# Patient Record
Sex: Male | Born: 1970 | Race: Black or African American | Hispanic: No | Marital: Married | Smoking: Never smoker
Health system: Southern US, Community
[De-identification: ages and names within clinical notes are randomized; demographics above are authoritative.]

## PROBLEM LIST (undated history)

## (undated) DIAGNOSIS — I10 Essential (primary) hypertension: Secondary | ICD-10-CM

## (undated) HISTORY — PX: ABDOMINAL SURGERY: SHX537

## (undated) HISTORY — PX: HERNIA REPAIR: SHX51

## (undated) HISTORY — DX: Essential (primary) hypertension: I10

---

## 2001-10-01 ENCOUNTER — Inpatient Hospital Stay (HOSPITAL_COMMUNITY): Admission: EM | Admit: 2001-10-01 | Discharge: 2001-10-01 | Payer: Self-pay | Admitting: *Deleted

## 2001-10-01 ENCOUNTER — Encounter: Payer: Self-pay | Admitting: Emergency Medicine

## 2008-01-25 ENCOUNTER — Encounter: Admission: RE | Admit: 2008-01-25 | Discharge: 2008-01-25 | Payer: Self-pay | Admitting: Internal Medicine

## 2009-09-18 ENCOUNTER — Emergency Department (HOSPITAL_COMMUNITY): Admission: EM | Admit: 2009-09-18 | Discharge: 2009-09-18 | Payer: Self-pay | Admitting: Emergency Medicine

## 2010-11-23 ENCOUNTER — Inpatient Hospital Stay (INDEPENDENT_AMBULATORY_CARE_PROVIDER_SITE_OTHER)
Admission: RE | Admit: 2010-11-23 | Discharge: 2010-11-23 | Disposition: A | Discharge status: Home / Self Care | Payer: BC Managed Care – PPO | Source: Ambulatory Visit | Attending: Emergency Medicine | Admitting: Emergency Medicine

## 2010-11-23 DIAGNOSIS — K219 Gastro-esophageal reflux disease without esophagitis: Secondary | ICD-10-CM

## 2011-01-31 NOTE — Consult Note (Signed)
[place]. [facility]  Patient:    Travis Vasquez Visit Number: [id number] MRN: [id number]          Service Type: MED Location: RCRM 2550 10 Attending Physician:  Susa Day Dictated by:   Katy Fitch Naaman Plummer., M.D. Proc. Date: 10/01/01 Admit Date:  10/01/2001 Discharge Date: 10/01/2001                            Consultation Report  CHIEF COMPLAINT:  Crushing amputation, left long finger with loss of nail and nail matrix and tuft of distal phalanx.  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  Earlier this evening while using a truck, he accidentally caught his left long finger between some shelves and a heavy portion of the machinery sustaining a crushing amputation of his left long fingertip.  He was transported to the [facility] Emergency Department where he was evaluated by Lorre Nick, M.D. emergency room physician.  X-rays of his finger documented an untidy avulsion amputation of his left long finger tip through the distal metaphysis of the distal phalanx.  He had an oblique soft tissue loss involving pulp, nail plate, nail fold, and exposure of the distal phalanx.  A 0.25% Marcaine digital block was placed for analgesia followed by 1 gm of Ancef as an IV prophylactic antibiotic.  His tetanus immunization was brought up to date.  A hand surgery consult was requested.  By clinical exam, Travis Vasquez was noted to have an untidy avulsion amputation. He had less than 50% of his ____________ nail matrix remaining.  Inspection of his x-rays revealed that he had a severely comminuted fracture.  Informed consent was completed with Travis Vasquez and his father.  We recommended transfer to the operating room for irrigation and debridement of his wound followed by probable revision amputation with removal of the remaining nail matrix.  An alternative  discussed was a possible reconstruction of the finger with a thenar flap.  Given the degree of bone loss as well as nail matrix loss in my judgment, revision is the more appropriate procedure, pending our detailed examination in the operating room.  Travis Vasquez past history was remarkable for no drug allergies.  He is on no current medications.  He has had minor surgeries in the past for repair of lacerations to his face and leg.  He had had no major surgery.  Family and social history are noncontributory and his review of systems noncontributory.  PHYSICAL EXAMINATION:  GENERAL:  The patient is a well-appearing 40 year old man.  He was awake and alert.  VITAL SIGNS:  Temperature 97.2, blood pressure 117/69, heart rate 69, respiratory rate 15.  There was no sign of trauma to any part other than his long finger.  HEENT:  Normocephalic and atraumatic.  NECK:  Unremarkable.  CHEST:  Clear to auscultation.  HEART:  S1, S2, no murmur or gallop appreciated.  ABDOMEN:  Normal.  NEUROLOGIC:  Normal except for the left long finger which was anesthetized due to Dr. West Carbo block.  ORTHOPEDIC:  His gait and stance were not tested.  His right upper extremity and both lower extremities looked normal.  The left upper extremity revealed an untidy amputation as mentioned above.  X-rays were reviewed revealing comminuted fracture of the distal phalanx with loss of the tuft.  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  PLAN:  He is scheduled for debridement and probable revision of his finger in the operating room immediately.  There is a small chance we will be able to reconstruct the fingertip from the thenar flap; however, given his occupational choices, he would be better served by a slightly shorter finger with normal sensibility.  Questions regarding the aforementioned procedure were invited and answered. Dictated by:   Katy Fitch Naaman Plummer., M.D. Attending  Physician:  Susa Day DD:  10/01/01 TD:  10/01/01 Job: [phone]

## 2011-01-31 NOTE — Op Note (Signed)
[place]. [facility]  Patient:    Travis Vasquez, Travis Vasquez Visit Number: [id number] MRN: [id number]          Service Type: MED Location: RCRM 2550 10 Attending Physician:  Susa Day Dictated by:   Katy Fitch Naaman Plummer., M.D. Proc. Date: 10/01/01 Admit Date:  10/01/2001 Discharge Date: 10/01/2001                             Operative Report  PREOPERATIVE DIAGNOSIS:  Severe crushing amputation, left long finger with comminuted fracture of the distal phalanx with avulsion of 80% of the nail matrix.  POSTOPERATIVE DIAGNOSIS:  Severe crushing amputation, left long finger with comminuted fracture of the distal phalanx with avulsion of 80% of the nail matrix.  OPERATION PERFORMED: 1. Revision amputation of left long finger with radial and ulnar flap closure. 2. Resection of residual dorsal intermediate and ventral nail fold.  SURGEON:  Katy Fitch. Sypher, Montez Hageman., M.D.  ASSISTANT:  None.  ANESTHESIA:  0.25% Marcaine and 2% lidocaine metacarpal head level block, left long finger supplemented by IV sedation.  SUPERVISING ANESTHESIOLOGIST:  Dr. Krista Blue.  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  He was transported to [facility] for definitive care of his finger.  A hand surgery consult was requested.  The aforementioned injury was noted and elective revision surgery to the finger recommended.  DESCRIPTION OF PROCEDURE:  Keeton Kassebaum was brought to the operating room and placed in supine position on the operating table.  The digital block placed by Dr. Freida Busman in the emergency room was still in effect.  This was supplemented with 0.25% Marcaine and 2% lidocaine at the metacarpal head level.  The left arm was prepped and Betadine soap and solution and sterilely  draped. The long finger was exsanguinated with gauze wrap and a half-inch Penrose drain was placed at the base of the finger as a digital tourniquet.  The procedure commenced with orderly debridement of the wound.  Devitalized skin and subcutaneous tissues were sharply divided.  The wound was irrigated with sterile saline and the fracture configuration identified.  The remaining dorsal intermediate and ventral nail fold was removed with 15 blade and 64 blade and rongeur.  Care was taken to remove the periosteum of the dorsal surface of the distal phalanx.  The distal phalanx was then shortened to a proximal metaphyseal level and rounded with rongeurs and a bone cutter.  The radial and ulnar flaps were regular; however, the soft tissue flap of fat and other subcutaneous tissues was adequate to cover the distal phalanx.  I covered approximately 80% of the wound with radial and ulnar flaps and will allow the remaining skin to heal by secondary intention.  This should optimize Mr. Spencers sensibility of the fingertip.  The wound was lavaged with sterile saline followed by dressing the wound with Xeroflo, sterile gauze and Coban.  There were no apparent complications.  For aftercare, Mr. Nabers was given prescriptions for Percocet 7.5 mg 1 or 2 tablets p.o. q.4-6h. pain and Keflex 500 mg 1 p.o. q.8h. x 5 days as a prophylactic antibiotic. Dictated by:   Katy Fitch Naaman Plummer., M.D. Attending Physician:  Susa Day DD:  10/01/01 TD:  10/01/01 Job: [phone] [id number]

## 2016-05-10 ENCOUNTER — Emergency Department (HOSPITAL_COMMUNITY)
Admission: EM | Admit: 2016-05-10 | Discharge: 2016-05-10 | Disposition: A | Discharge status: Home / Self Care | Payer: BLUE CROSS/BLUE SHIELD | Attending: Emergency Medicine | Admitting: Emergency Medicine

## 2016-05-10 ENCOUNTER — Encounter (HOSPITAL_COMMUNITY): Payer: Self-pay | Admitting: *Deleted

## 2016-05-10 DIAGNOSIS — M25562 Pain in left knee: Secondary | ICD-10-CM

## 2016-05-10 DIAGNOSIS — M25462 Effusion, left knee: Secondary | ICD-10-CM

## 2016-05-10 MED ORDER — NAPROXEN 375 MG PO TABS: 375.0000 mg | ORAL_TABLET | Freq: Two times a day (BID) | ORAL | 0 refills | Status: DC

## 2016-05-10 NOTE — Discharge Instructions (Signed)
Return to the Ed if you develop symptoms of swelling, pain, heaviness, heat, redness in the left leg.

## 2016-05-10 NOTE — ED Provider Notes (Signed)
[facility]-EMERGENCY DEPT Provider Note   CSN: [id number] Arrival date & time: 05/10/16  1920  By signing my name below, I, Alyssa Grove, attest that this documentation has been prepared under the direction and in the presence of Arthor Captain, PA-C. Electronically Signed: Alyssa Grove, ED Scribe. 05/10/16. 7:56 PM.  History   Chief Complaint Chief Complaint  Patient presents with  . Knee Pain    The history is provided by the patient. No language interpreter was used.    HPI Comments: Travis Vasquez is a 45 y.o. male who presents to the Emergency Department complaining of constant left knee pain onset 1 week. Pt states when feels a painful pop when he goes to straighten his left knee. Pt states last week he shot hoops with his son and noticed his knee felt tight. He states he has been dealing with fluid this whole week and went to have the fluid drained. Pt states he was seen by his PCP today for left knee pain and was diagnosed with a broken left knee. Pt was then informed that he had a blood clot in his left knee. Pt did not receive an [place]. Pt had arthroscopic surgery 5 years ago. Pt denies leg pain.  History reviewed. No pertinent past medical history.  There are no active problems to display for this patient.   History reviewed. No pertinent surgical history.   Home Medications    Prior to Admission medications   Not on File    Family History History reviewed. No pertinent family history.  Social History Social History  Substance Use Topics  . Smoking status: Never Smoker  . Smokeless tobacco: Never Used  . Alcohol use No     Allergies   Keflex [cephalexin]   Review of Systems Review of Systems  Constitutional: Negative for fever.  Musculoskeletal: Positive for arthralgias.    Physical Exam Updated Vital Signs BP 141/94 (BP Location: Right Arm)   Pulse 85   Temp 98.1 F (36.7 C) (Oral)   Resp 16   Ht 5\' 9"  (1.753 m)   Wt 205 lb 3 oz (93.1 kg)   SpO2  96%   BMI 30.30 kg/m   Physical Exam  Constitutional: He appears well-developed and well-nourished.  HENT:  Head: Normocephalic.  Eyes: Conjunctivae are normal.  Cardiovascular: Normal rate.   Pulmonary/Chest: Effort normal. No respiratory distress.  Abdominal: He exhibits no distension.  Musculoskeletal: Normal range of motion.  Full ROM of left knee No crepitus  Ligaments are stable No calf tenderness   Neurological: He is alert.  Skin: Skin is warm and dry.  Psychiatric: He has a normal mood and affect. His behavior is normal.  Nursing note and vitals reviewed.  ED Treatments / Results  DIAGNOSTIC STUDIES: Oxygen Saturation is 96% on RA, adequate by my interpretation.    COORDINATION OF CARE: 7:56 PM Discussed treatment plan with pt at bedside which includes Knee Sleeve and Sports Medicine Referral and pt agreed to plan.  Labs (all labs ordered are listed, but only abnormal results are displayed) Labs Reviewed - No data to display  EKG  EKG Interpretation None       Radiology No results found.  Procedures Procedures (including critical care time)  Medications Ordered in ED Medications - No data to display   Initial Impression / Assessment and Plan / ED Course  I have reviewed the triage vital signs and the nursing notes.  Pertinent labs & imaging results that were available during my care  of the patient were reviewed by me and considered in my medical decision making (see chart for details).  Clinical Course   Took imaging down to radiology room and reviewed CD with Dr. 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  Will send home with knee sleeve and referall to Dr. Pearletha ForgeHudnall in Sports Medicine  Patient X-Ray negative for obvious fracture or dislocation.  Pain managed in ED. Pt advised to follow up with orthopedics if symptoms persist for possibility of missed fracture diagnosis. Patient given brace while in ED, conservative therapy recommended and discussed. Patient will be dc home & is agreeable with above plan.  Final Clinical Impressions(s) / ED Diagnoses   Final diagnoses:  Knee pain, acute, left  Knee effusion, left    New Prescriptions New Prescriptions   No medications on file   I personally performed the services described in this documentation, which was scribed in my presence. The recorded information has been reviewed and is accurate.       Arthor Captainbigail Immaculate Crutcher, PA-C 05/10/16 2035    Arby BarretteMarcy Pfeiffer, MD 05/10/16 [phone]

## 2016-05-10 NOTE — ED Triage Notes (Addendum)
Pt states he went to PCP for left knee pain, had XR done told he had a broken left knee, then came back and told him he had a blood clot in his left knee, pt was instructed to come to ED for further eval. [facility] Pt did not have a US done. Pt reports knee pain for a week.

## 2016-05-13 ENCOUNTER — Ambulatory Visit (INDEPENDENT_AMBULATORY_CARE_PROVIDER_SITE_OTHER): Payer: BLUE CROSS/BLUE SHIELD | Admitting: Family Medicine

## 2016-05-13 ENCOUNTER — Encounter: Payer: Self-pay | Admitting: Family Medicine

## 2016-05-13 DIAGNOSIS — M25562 Pain in left knee: Secondary | ICD-10-CM

## 2016-05-13 NOTE — Patient Instructions (Signed)
Your pain and swelling are consistent with post-surgical synovitis, less likely very mild arthritis (your x-rays look good though) or new meniscus tear. These are treated similarly. Take aleve 2 tabs twice a day with food OR ibuprofen 600mg  three times a day with food for pain and inflammation. Icing 15 minutes at a time 3-4 times a day. Elevate above the level of your heart as much as possible. Compression sleeve or ACE wrap to keep swelling down. Avoid deep squats, deep lunges, leg press for next couple weeks. Call me if you're struggling and want to try an injection. Otherwise follow up with me as needed.

## 2016-05-15 DIAGNOSIS — M25562 Pain in left knee: Secondary | ICD-10-CM | POA: Insufficient documentation

## 2016-05-15 NOTE — Assessment & Plan Note (Signed)
Exam is benign, reassuring and no mechanism of injury.  Independently reviewed radiographs and no acute fracture noted - at tibial tubercle he has well corticated calcification consistent with with intratendinous calcification or history of osgood-schlatters when he was younger.  Ultrasound confirms no edema surrounding this to suggest acute fracture.  Reassured patient.  Likely due to post-surgical synovitis, less likely mild arthritis or new meniscus tear (no injury).  Start with icing, compression, elevation, nsaids.  Consider injection if not improving.  F/u prn.

## 2016-05-15 NOTE — Progress Notes (Signed)
PCP: No PCP Per Patient  Subjective:   HPI: Patient is a 45 y.o. male here for left knee pain.  Patient reports on 8/19 he was shooting basketball with his son. No acute injury during this but day afterwards his left knee felt tight, swollen. No pain posteriorly. Had radiographs and told he had a fracture - sent to ED and given a sleeve there. Pain is 0/10 at rest and has improved. Was 10/10 and sharp at worst, difficult to walk. Pops superior patella area. History of arthroscopic surgery of this knee about 5 years ago. No skin changes, numbness.  No past medical history on file.  Current Outpatient Prescriptions on File Prior to Visit  Medication Sig Dispense Refill  . naproxen (NAPROSYN) 375 MG tablet Take 1 tablet (375 mg total) by mouth 2 (two) times daily. 20 tablet 0   No current facility-administered medications on file prior to visit.     Past Surgical History:  Procedure Laterality Date  . ABDOMINAL SURGERY      Allergies  Allergen Reactions  . Keflex [Cephalexin] Nausea And Vomiting    Social History   Social History  . Marital status: Single    Spouse name: N/A  . Number of children: N/A  . Years of education: N/A   Occupational History  . Not on file.   Social History Main Topics  . Smoking status: Never Smoker  . Smokeless tobacco: Never Used  . Alcohol use No  . Drug use: No  . Sexual activity: Not on file   Other Topics Concern  . Not on file   Social History Narrative  . No narrative on file    No family history on file.  BP 133/86   Pulse 71   Ht 5\' 9"  (1.753 m)   Wt 205 lb (93 kg)   BMI 30.27 kg/m   Review of Systems: See HPI above.    Objective:  Physical Exam:  Gen: NAD, comfortable in exam room  Left knee: Mild effusion.  No bruising, other deformity. Minimal tenderness medial patellar facet.  No other tenderness. FROM - tightness with full flexion. Negative ant/post drawers. Negative valgus/varus testing. Negative  lachmanns. Negative mcmurrays, apleys, patellar apprehension. NV intact distally.  Right knee: FROM without pain.    Assessment & Plan:  1. Left knee pain - Exam is benign, reassuring and no mechanism of injury.  Independently reviewed radiographs and no acute fracture noted - at tibial tubercle he has well corticated calcification consistent with with intratendinous calcification or history of osgood-schlatters when he was younger.  Ultrasound confirms no edema surrounding this to suggest acute fracture.  Reassured patient.  Likely due to post-surgical synovitis, less likely mild arthritis or new meniscus tear (no injury).  Start with icing, compression, elevation, nsaids.  Consider injection if not improving.  F/u prn.

## 2016-05-23 ENCOUNTER — Ambulatory Visit: Payer: BLUE CROSS/BLUE SHIELD | Admitting: Family Medicine

## 2016-05-26 ENCOUNTER — Ambulatory Visit: Payer: BLUE CROSS/BLUE SHIELD | Admitting: Family Medicine

## 2016-05-29 ENCOUNTER — Encounter: Payer: Self-pay | Admitting: Family Medicine

## 2016-05-29 ENCOUNTER — Ambulatory Visit (INDEPENDENT_AMBULATORY_CARE_PROVIDER_SITE_OTHER): Payer: BLUE CROSS/BLUE SHIELD | Admitting: Family Medicine

## 2016-05-29 DIAGNOSIS — M25562 Pain in left knee: Secondary | ICD-10-CM | POA: Diagnosis not present

## 2016-05-29 NOTE — Patient Instructions (Signed)
Take aleve 2 tabs twice a day with food OR ibuprofen 600mg  three times a day with food for pain and inflammation only if needed. Icing 15 minutes at a time 3-4 times a day as needed. Elevate above the level of your heart as much as possible for swelling. Compression sleeve or ACE wrap to keep swelling down. Avoid deep squats, deep lunges, leg press for 4 more weeks. Consider physical therapy, MRI, injection (though with your bad reaction with hiccuping would likely do the other two instead) if not improving. Follow up with me in 4 weeks or as needed.

## 2016-06-03 NOTE — Assessment & Plan Note (Signed)
Exam is benign, reassuring and no mechanism of injury.  Independently reviewed radiographs last visit and no acute fracture noted.  Likely due to post-surgical synovitis, less likely mild arthritis or new meniscus tear (no injury).  He would like to continue conservative measures for now - nsaids, icing, compression, home exercises.  F/u in 4 weeks or prn.  Light duty note provided for 4 weeks.

## 2016-06-03 NOTE — Progress Notes (Signed)
PCP: No PCP Per Patient  Subjective:   HPI: Patient is a 45 y.o. male here for left knee pain.  8/29: Patient reports on 8/19 he was shooting basketball with his son. No acute injury during this but day afterwards his left knee felt tight, swollen. No pain posteriorly. Had radiographs and told he had a fracture - sent to ED and given a sleeve there. Pain is 0/10 at rest and has improved. Was 10/10 and sharp at worst, difficult to walk. Pops superior patella area. History of arthroscopic surgery of this knee about 5 years ago. No skin changes, numbness.  9/14: Patient reports he feels better compared to last visit. Still can't fully bend the knee. Some pain and popping of the knee. Pain at rest is 0/10. Has done naproxen - still babying the knee. No skin changes, numbness.  No past medical history on file.  Current Outpatient Prescriptions on File Prior to Visit  Medication Sig Dispense Refill  . naproxen (NAPROSYN) 375 MG tablet Take 1 tablet (375 mg total) by mouth 2 (two) times daily. 20 tablet 0   No current facility-administered medications on file prior to visit.     Past Surgical History:  Procedure Laterality Date  . ABDOMINAL SURGERY      Allergies  Allergen Reactions  . Keflex [Cephalexin] Nausea And Vomiting    Social History   Social History  . Marital status: Single    Spouse name: N/A  . Number of children: N/A  . Years of education: N/A   Occupational History  . Not on file.   Social History Main Topics  . Smoking status: Never Smoker  . Smokeless tobacco: Never Used  . Alcohol use No  . Drug use: No  . Sexual activity: Not on file   Other Topics Concern  . Not on file   Social History Narrative  . No narrative on file    No family history on file.  BP 135/85   Pulse 70   Ht 5\' 10"  (1.778 m)   Wt 205 lb (93 kg)   BMI 29.41 kg/m   Review of Systems: See HPI above.    Objective:  Physical Exam:  Gen: NAD, comfortable in  exam room  Left knee: Minimal effusion.  No bruising, other deformity. Minimal tenderness medial patellar facet.  No other tenderness. FROM - tightness with full flexion. Negative ant/post drawers. Negative valgus/varus testing. Negative lachmanns. Negative mcmurrays, apleys, patellar apprehension. NV intact distally.  Right knee: FROM without pain.    Assessment & Plan:  1. Left knee pain - Exam is benign, reassuring and no mechanism of injury.  Independently reviewed radiographs last visit and no acute fracture noted.  Likely due to post-surgical synovitis, less likely mild arthritis or new meniscus tear (no injury).  He would like to continue conservative measures for now - nsaids, icing, compression, home exercises.  F/u in 4 weeks or prn.  Light duty note provided for 4 weeks.

## 2017-10-27 ENCOUNTER — Encounter (HOSPITAL_COMMUNITY): Payer: Self-pay | Admitting: Family Medicine

## 2017-10-27 ENCOUNTER — Ambulatory Visit (HOSPITAL_COMMUNITY)
Admission: EM | Admit: 2017-10-27 | Discharge: 2017-10-27 | Disposition: A | Discharge status: Home / Self Care | Payer: BC Managed Care – PPO | Attending: Family Medicine | Admitting: Family Medicine

## 2017-10-27 DIAGNOSIS — R69 Illness, unspecified: Secondary | ICD-10-CM

## 2017-10-27 DIAGNOSIS — J209 Acute bronchitis, unspecified: Secondary | ICD-10-CM

## 2017-10-27 DIAGNOSIS — J111 Influenza due to unidentified influenza virus with other respiratory manifestations: Secondary | ICD-10-CM

## 2017-10-27 MED ORDER — BENZONATATE 100 MG PO CAPS: 100.0000 mg | ORAL_CAPSULE | Freq: Three times a day (TID) | ORAL | 0 refills | Status: DC

## 2017-10-27 MED ORDER — IPRATROPIUM BROMIDE 0.06 % NA SOLN: 2.0000 | Freq: Four times a day (QID) | NASAL | 0 refills | Status: DC

## 2017-10-27 MED ORDER — PREDNISONE 20 MG PO TABS: 40.0000 mg | ORAL_TABLET | Freq: Every day | ORAL | 0 refills | Status: AC

## 2017-10-27 MED ORDER — FLUTICASONE PROPIONATE 50 MCG/ACT NA SUSP: 2.0000 | Freq: Every day | NASAL | 0 refills | Status: DC

## 2017-10-27 MED ORDER — AZITHROMYCIN 250 MG PO TABS: 250.0000 mg | ORAL_TABLET | Freq: Every day | ORAL | 0 refills | Status: DC

## 2017-10-27 NOTE — ED Triage Notes (Signed)
Pt here for cold symptoms over the past few weeks and this am woke up with fever, chills and headache. Took ibuprofen.

## 2017-10-27 NOTE — Discharge Instructions (Signed)
Start azithromycin and prednisone for bronchitis. Tessalon for cough. Start flonase, atrovent nasal spray for nasal congestion/drainage. You can use over the counter nasal saline rinse such as neti pot for nasal congestion. Keep hydrated, your urine should be clear to pale yellow in color. Tylenol/motrin for fever and pain. Monitor for any worsening of symptoms, chest pain, shortness of breath, wheezing, swelling of the throat, follow up for reevaluation.

## 2017-10-27 NOTE — ED Provider Notes (Signed)
[facility]    CSN: [id number] Arrival date & time: 10/27/17  1230     History   Chief Complaint Chief Complaint  Patient presents with  . Fever  . Headache    HPI Travis Vasquez is a 47 y.o. male.   47 year old male comes in for 2 week history of URI symptoms with sudden worsening last night. Had continued productive cough, rhinorrhea, nasal congestion.  States woke up this morning with fever, body aches, worsening cough. Tmax 102, ibuprofen. States recent flu contact. Nasal spray with good relief. Never smoker.       History reviewed. No pertinent past medical history.  Patient Active Problem List   Diagnosis Date Noted  . Left knee pain 05/15/2016    Past Surgical History:  Procedure Laterality Date  . ABDOMINAL SURGERY         Home Medications    Prior to Admission medications   Medication Sig Start Date End Date Taking? Authorizing Provider  azithromycin (ZITHROMAX) 250 MG tablet Take 1 tablet (250 mg total) by mouth daily. Take first 2 tablets together, then 1 every day until finished. 10/27/17   Cathie HoopsYu, Antwyne Pingree V, PA-C  benzonatate (TESSALON) 100 MG capsule Take 1 capsule (100 mg total) by mouth every 8 (eight) hours. 10/27/17   Cathie HoopsYu, Torie Priebe V, PA-C  fluticasone (FLONASE) 50 MCG/ACT nasal spray Place 2 sprays into both nostrils daily. 10/27/17   Cathie HoopsYu, Muriel Wilber V, PA-C  ipratropium (ATROVENT) 0.06 % nasal spray Place 2 sprays into both nostrils 4 (four) times daily. 10/27/17   Cathie HoopsYu, Chelsei Mcchesney V, PA-C  naproxen (NAPROSYN) 375 MG tablet Take 1 tablet (375 mg total) by mouth 2 (two) times daily. 05/10/16   Arthor CaptainHarris, Abigail, PA-C  predniSONE (DELTASONE) 20 MG tablet Take 2 tablets (40 mg total) by mouth daily for 3 days. 10/27/17 10/30/17  Belinda FisherYu, Jiah Bari V, PA-C    Family History History reviewed. No pertinent family history.  Social History Social History   Tobacco Use  . Smoking status: Never Smoker  . Smokeless tobacco: Never Used  Substance Use Topics  . Alcohol use: No  . Drug  use: No     Allergies   Keflex [cephalexin]   Review of Systems Review of Systems  Reason unable to perform ROS: See HPI as above.     Physical Exam Triage Vital Signs ED Triage Vitals [10/27/17 1305]  Enc Vitals Group     BP (!) 156/89     Pulse Rate (!) 109     Resp 18     Temp 99.2 F (37.3 C)     Temp src      SpO2 100 %     Weight      Height      Head Circumference      Peak Flow      Pain Score      Pain Loc      Pain Edu?      Excl. in GC?    No data found.  Updated Vital Signs BP (!) 156/89   Pulse (!) 109   Temp 99.2 F (37.3 C)   Resp 18   SpO2 100%   Physical Exam  Constitutional: He is oriented to person, place, and time. He appears well-developed and well-nourished. No distress.  HENT:  Head: Normocephalic and atraumatic.  Right Ear: External ear and ear canal normal. Tympanic membrane is erythematous. Tympanic membrane is not bulging.  Left Ear: External ear and ear canal normal. Tympanic  membrane is erythematous. Tympanic membrane is not bulging.  Nose: Mucosal edema and rhinorrhea present. Right sinus exhibits no maxillary sinus tenderness and no frontal sinus tenderness. Left sinus exhibits no maxillary sinus tenderness and no frontal sinus tenderness.  Mouth/Throat: Uvula is midline, oropharynx is clear and moist and mucous membranes are normal. No tonsillar exudate.  Eyes: Conjunctivae are normal. Pupils are equal, round, and reactive to light.  Neck: Normal range of motion. Neck supple.  Cardiovascular: Normal rate, regular rhythm and normal heart sounds. Exam reveals no gallop and no friction rub.  No murmur heard. Pulmonary/Chest: Effort normal and breath sounds normal. He has no decreased breath sounds. He has no wheezes. He has no rhonchi. He has no rales.  Lymphadenopathy:    He has no cervical adenopathy.  Neurological: He is alert and oriented to person, place, and time.  Skin: Skin is warm and dry.  Psychiatric: He has a normal  mood and affect. His behavior is normal. Judgment normal.   UC Treatments / Results  Labs (all labs ordered are listed, but only abnormal results are displayed) Labs Reviewed - No data to display  EKG  EKG Interpretation None       Radiology No results found.  Procedures Procedures (including critical care time)  Medications Ordered in UC Medications - No data to display   Initial Impression / Assessment and Plan / UC Course  I have reviewed the triage vital signs and the nursing notes.  Pertinent labs & imaging results that were available during my care of the patient were reviewed by me and considered in my medical decision making (see chart for details).    Discussed possible flu superimposing bronchitis given 1 day history of sudden worsening of symptoms. Discussed treating with azithromycin and tamiflu vs azithromycin with symptomatic treatment. Patient would like to defer tamiflu for now. Azithromycin, prednisone as directed. Other symptomatic treatment discussed. Push fluids. Return precautions given. Patient expresses understanding and agrees to plan.   Final Clinical Impressions(s) / UC Diagnoses   Final diagnoses:  Acute bronchitis, unspecified organism  Influenza-like illness    ED Discharge Orders        Ordered    azithromycin (ZITHROMAX) 250 MG tablet  Daily     10/27/17 1326    predniSONE (DELTASONE) 20 MG tablet  Daily     10/27/17 1326    fluticasone (FLONASE) 50 MCG/ACT nasal spray  Daily     10/27/17 1326    ipratropium (ATROVENT) 0.06 % nasal spray  4 times daily     10/27/17 1326    benzonatate (TESSALON) 100 MG capsule  Every 8 hours     10/27/17 1326        Belinda Fisher, PA-C 10/27/17 1344

## 2019-03-08 ENCOUNTER — Other Ambulatory Visit: Payer: Self-pay | Admitting: Internal Medicine

## 2019-03-08 DIAGNOSIS — Z20822 Contact with and (suspected) exposure to covid-19: Secondary | ICD-10-CM

## 2019-03-11 LAB — NOVEL CORONAVIRUS, NAA: SARS-CoV-2, NAA: NOT DETECTED

## 2019-04-08 ENCOUNTER — Encounter (HOSPITAL_COMMUNITY): Payer: Self-pay | Admitting: *Deleted

## 2019-04-08 ENCOUNTER — Other Ambulatory Visit: Payer: Self-pay

## 2019-04-08 ENCOUNTER — Emergency Department (HOSPITAL_COMMUNITY): Payer: BC Managed Care – PPO

## 2019-04-08 ENCOUNTER — Emergency Department (HOSPITAL_COMMUNITY)
Admission: EM | Admit: 2019-04-08 | Discharge: 2019-04-08 | Disposition: A | Discharge status: Home / Self Care | Payer: BC Managed Care – PPO | Attending: Emergency Medicine | Admitting: Emergency Medicine

## 2019-04-08 DIAGNOSIS — N289 Disorder of kidney and ureter, unspecified: Secondary | ICD-10-CM | POA: Insufficient documentation

## 2019-04-08 DIAGNOSIS — I10 Essential (primary) hypertension: Secondary | ICD-10-CM | POA: Insufficient documentation

## 2019-04-08 DIAGNOSIS — Z79899 Other long term (current) drug therapy: Secondary | ICD-10-CM | POA: Diagnosis not present

## 2019-04-08 DIAGNOSIS — R4781 Slurred speech: Secondary | ICD-10-CM | POA: Diagnosis present

## 2019-04-08 LAB — CBC WITH DIFFERENTIAL/PLATELET
Abs Immature Granulocytes: 0.01 10*3/uL (ref 0.00–0.07)
Basophils Absolute: 0.1 10*3/uL (ref 0.0–0.1)
Basophils Relative: 1 %
Eosinophils Absolute: 0.1 10*3/uL (ref 0.0–0.5)
Eosinophils Relative: 2 %
HCT: 45 % (ref 39.0–52.0)
Hemoglobin: 14.9 g/dL (ref 13.0–17.0)
Immature Granulocytes: 0 %
Lymphocytes Relative: 42 %
Lymphs Abs: 2.4 10*3/uL (ref 0.7–4.0)
MCH: 28.6 pg (ref 26.0–34.0)
MCHC: 33.1 g/dL (ref 30.0–36.0)
MCV: 86.4 fL (ref 80.0–100.0)
Monocytes Absolute: 0.4 10*3/uL (ref 0.1–1.0)
Monocytes Relative: 7 %
Neutro Abs: 2.7 10*3/uL (ref 1.7–7.7)
Neutrophils Relative %: 48 %
Platelets: 246 10*3/uL (ref 150–400)
RBC: 5.21 MIL/uL (ref 4.22–5.81)
RDW: 13.8 % (ref 11.5–15.5)
WBC: 5.7 10*3/uL (ref 4.0–10.5)
nRBC: 0 % (ref 0.0–0.2)

## 2019-04-08 LAB — BASIC METABOLIC PANEL
Anion gap: 13 (ref 5–15)
BUN: 17 mg/dL (ref 6–20)
CO2: 24 mmol/L (ref 22–32)
Calcium: 9.4 mg/dL (ref 8.9–10.3)
Chloride: 101 mmol/L (ref 98–111)
Creatinine, Ser: 1.65 mg/dL — ABNORMAL HIGH (ref 0.61–1.24)
GFR calc Af Amer: 57 mL/min — ABNORMAL LOW (ref >60)
GFR calc non Af Amer: 49 mL/min — ABNORMAL LOW (ref >60)
Glucose, Bld: 99 mg/dL (ref 70–99)
Potassium: 4.6 mmol/L (ref 3.5–5.1)
Sodium: 138 mmol/L (ref 135–145)

## 2019-04-08 MED ORDER — AMLODIPINE BESYLATE 5 MG PO TABS: 5.0000 mg | ORAL_TABLET | Freq: Every day | ORAL | 1 refills | Status: DC

## 2019-04-08 NOTE — ED Notes (Signed)
Patient given discharge teaching and verbalized understanding. Patient ambulated out of ED with a steady gait. 

## 2019-04-08 NOTE — ED Provider Notes (Signed)
[place] DEPT Provider Note   CSN: [id number] Arrival date & time: 04/08/19  1214    History   Chief Complaint Chief Complaint  Patient presents with   Slurred speech    HPI Travis Vasquez is a 48 y.o. male.     HPI   He presents for evaluation of a constellation of symptoms including transient blurred vision, slurred speech, and a "shooting sensation in his left arm."  This morning he saw a PCP at an urgent care, with these complaints and he was sent here because his blood pressure was elevated, to be evaluated for stroke.  He reports 1 week ago he was trying to watch TV but had difficulty seeing the screen because his vision was blurred.  The sensation resolved within a few hours.  Today he awoke and noticed there was a shooting sensation, "like a pulse," in his left upper medial arm radiating to his left medial forearm.  It did not radiate to the left fingers.  He denies a sensation of numbness, there is a character for this pain.  He denies weakness of his arms or legs.  The primary care doctor that saw him today felt like he might of had facial asymmetry.  The patient feels like his slurred speech has improved spontaneously.  The patient was noted to have high blood pressure today but does not take blood pressure medication.  He states the pressure has been somewhat high in the past.  He denies headache, neck pain, back pain, difficulty walking, any numbness, or dizziness.  There are no other known modifying factors.  History reviewed. No pertinent past medical history.  Patient Active Problem List   Diagnosis Date Noted   Left knee pain 05/15/2016    Past Surgical History:  Procedure Laterality Date   ABDOMINAL SURGERY          Home Medications    Prior to Admission medications   Medication Sig Start Date End Date Taking? Authorizing Provider  ibuprofen (ADVIL) 200 MG tablet Take 600 mg by mouth every 6 (six) hours as needed for  fever, headache or moderate pain.   Yes [provider]  Multiple Vitamin (MULTIVITAMIN WITH MINERALS) TABS tablet Take 1 tablet by mouth daily.   Yes [provider]  OVER THE COUNTER MEDICATION Take 1 drop by mouth daily. CBD   Yes [provider]  amLODipine (NORVASC) 5 MG tablet Take 1 tablet (5 mg total) by mouth daily. 04/08/19   Daleen Bo, MD  azithromycin (ZITHROMAX) 250 MG tablet Take 1 tablet (250 mg total) by mouth daily. Take first 2 tablets together, then 1 every day until finished. Patient not taking: Reported on 04/08/2019 10/27/17   Ok Edwards, PA-C  benzonatate (TESSALON) 100 MG capsule Take 1 capsule (100 mg total) by mouth every 8 (eight) hours. Patient not taking: Reported on 04/08/2019 10/27/17   Ok Edwards, PA-C  fluticasone [facility]) 50 MCG/ACT nasal spray Place 2 sprays into both nostrils daily. Patient not taking: Reported on 04/08/2019 10/27/17   Ok Edwards, PA-C  ipratropium (ATROVENT) 0.06 % nasal spray Place 2 sprays into both nostrils 4 (four) times daily. Patient not taking: Reported on 04/08/2019 10/27/17   Ok Edwards, PA-C  naproxen (NAPROSYN) 375 MG tablet Take 1 tablet (375 mg total) by mouth 2 (two) times daily. Patient not taking: Reported on 04/08/2019 05/10/16   Margarita Mail, PA-C    Family History No family history on file.  Social History Social History   Tobacco Use   Smoking status: Never Smoker   Smokeless tobacco: Never Used  Substance Use Topics   Alcohol use: No   Drug use: No     Allergies   Keflex [cephalexin]   Review of Systems Review of Systems  All other systems reviewed and are negative.    Physical Exam Updated Vital Signs BP (!) 186/120 (BP Location: Left Arm)    Pulse 64    Temp 98 F (36.7 C) (Oral)    Resp 18    SpO2 94%   Physical Exam Vitals signs and nursing note reviewed.  Constitutional:      General: He is not in acute distress.    Appearance: He is well-developed. He is not  ill-appearing, toxic-appearing or diaphoretic.  HENT:     Head: Normocephalic and atraumatic.     Right Ear: External ear normal.     Left Ear: External ear normal.     Mouth/Throat:     Pharynx: No oropharyngeal exudate or posterior oropharyngeal erythema.  Eyes:     Conjunctiva/sclera: Conjunctivae normal.     Pupils: Pupils are equal, round, and reactive to light.  Neck:     Musculoskeletal: Normal range of motion and neck supple.     Trachea: Phonation normal.  Cardiovascular:     Rate and Rhythm: Normal rate and regular rhythm.     Heart sounds: Normal heart sounds.  Pulmonary:     Effort: Pulmonary effort is normal.     Breath sounds: Normal breath sounds.  Abdominal:     Palpations: Abdomen is soft.     Tenderness: There is no abdominal tenderness.  Musculoskeletal: Normal range of motion.     Comments: Normal strength, arms and legs bilaterally.  Skin:    General: Skin is warm and dry.  Neurological:     Mental Status: He is alert and oriented to person, place, and time.     Cranial Nerves: No cranial nerve deficit.     Sensory: No sensory deficit.     Motor: No abnormal muscle tone.     Coordination: Coordination normal.     Comments: No dysarthria, aphasia or nystagmus.  Psychiatric:        Mood and Affect: Mood normal.        Behavior: Behavior normal.        Thought Content: Thought content normal.        Judgment: Judgment normal.      ED Treatments / Results  Labs (all labs ordered are listed, but only abnormal results are displayed) Labs Reviewed  BASIC METABOLIC PANEL - Abnormal; Notable for the following components:      Result Value   Creatinine, Ser 1.65 (*)    GFR calc non Af Amer 49 (*)    GFR calc Af Amer 57 (*)    All other components within normal limits  CBC WITH DIFFERENTIAL/PLATELET    EKG EKG Interpretation  Date/Time:  Friday April 08 2019 12:26:38 EDT Ventricular Rate:  69 PR Interval:    QRS Duration: 75 QT  Interval:  402 QTC Calculation: 431 R Axis:   25 Text Interpretation:  Sinus rhythm ST elev, probable normal early repol pattern No old tracing to compare Confirmed by Mancel BaleWentz, Jamarea Selner [phone]([id number]) on 04/08/2019 3:05:54 PM   Radiology Ct Head Wo Contrast  Result Date: 04/08/2019 CLINICAL DATA:  Slurred speech with pulsing sensation in left arm on awakening. Blurred vision several days ago. History  of hypertension. EXAM: CT HEAD WITHOUT CONTRAST TECHNIQUE: Contiguous axial images were obtained from the base of the skull through the vertex without intravenous contrast. COMPARISON:  None. FINDINGS: Brain: There is no evidence of acute intracranial hemorrhage, mass lesion, brain edema or extra-axial fluid collection. The ventricles and subarachnoid spaces are appropriately sized for age. There is no CT evidence of acute cortical infarction. Vascular:  No hyperdense vessel identified. Skull: Negative for fracture or focal lesion. Sinuses/Orbits: The visualized paranasal sinuses and mastoid air cells are clear. No orbital abnormalities are seen. Other: None. IMPRESSION: Negative noncontrast head CT.  No CT evidence of acute stroke. Electronically Signed   By: Carey BullocksWilliam  Veazey M.D.   On: 04/08/2019 14:44    Procedures Procedures (including critical care time)  Medications Ordered in ED Medications - No data to display   Initial Impression / Assessment and Plan / ED Course  I have reviewed the triage vital signs and the nursing notes.  Pertinent labs & imaging results that were available during my care of the patient were reviewed by me and considered in my medical decision making (see chart for details).  Clinical Course as of Apr 07 1517  Fri Apr 08, 2019  1502 Normal except creatinine high, GFR low  Basic metabolic panel(!) [EW]  1502 Normal  CBC with Differential [EW]  1504 No cranial bleeding or swelling.  Images reviewed by me  CT Head Wo Contrast [EW]    Clinical Course User Index [EW] Mancel BaleWentz,  Yonis Carreon, MD        Patient Vitals for the past 24 hrs:  BP Temp Temp src Pulse Resp SpO2  04/08/19 1220 (!) 186/120 98 F (36.7 C) Oral 64 18 94 %    3:02 PM Reevaluation with update and discussion. After initial assessment and treatment, an updated evaluation reveals he is comfortable.  He denies headache, blurred vision, nausea vomiting.  He states he previously had a diagnosis of kidney problems, after having a Streptococcus infection.  Findings discussed with the patient and all questions were answered. Mancel BaleElliott Daisie Haft   Medical Decision Making: High blood pressure with possible secondary renal insufficiency.  He will need further evaluation, possibly consultation with nephrology because of prior streptococcal related kidney problem.  Doubt CVA, ACS, metabolic instability at this time.  Patient was counseled by me regarding low-salt diet, treatment of hypertension and requirement for long-term follow-up with PCP.  He states he has a full appointment with his PCP, in 3 days.  CRITICAL CARE-no Performed by: Mancel BaleElliott Jaskirat Schwieger  Nursing Notes Reviewed/ Care Coordinated Applicable Imaging Reviewed Interpretation of Laboratory Data incorporated into ED treatment  The patient appears reasonably screened and/or stabilized for discharge and I doubt any other medical condition or other [facility] requiring further screening, evaluation, or treatment in the ED at this time prior to discharge.  Plan: Home Medications-OTC as needed; Home Treatments-rest, fluids; return here if the recommended treatment, does not improve the symptoms; Recommended follow up-PCP, checkup 1 week.   Final Clinical Impressions(s) / ED Diagnoses   Final diagnoses:  Hypertension, unspecified type  Renal insufficiency    ED Discharge Orders         Ordered    amLODipine (NORVASC) 5 MG tablet  Daily     04/08/19 1516           Mancel BaleWentz, Angelic Schnelle, MD 04/08/19 [id number]

## 2019-04-08 NOTE — ED Triage Notes (Signed)
Pt was sent by PCP d/t concern pt may have had a stroke. Pt states he woke up with a pulsing sensation in his left arm and slurred speech. Pt states he noticed blurred vision a couple of days ago, which he states has resolved. Pt is hypertensive in triage, denies history of hypertension. No facial droop or arm drift noted in triage. Pt states he still feels he is slurring his speech.

## 2019-04-17 NOTE — Progress Notes (Signed)
Virtual Visit via Video Note The purpose of this virtual visit is to provide medical care while limiting exposure to the novel coronavirus.    Consent was obtained for video visit:  Yes Answered questions that patient had about telehealth interaction:  Yes I discussed the limitations, risks, security and privacy concerns of performing an evaluation and management service by telemedicine. I also discussed with the patient that there may be a patient responsible charge related to this service. The patient expressed understanding and agreed to proceed.  Pt location: Home Physician Location: Home Name of referring provider:  Sandi Mariscal, MD I connected with Doneta Public at patients initiation/request on 04/18/2019 at 12:50 PM EDT by video enabled telemedicine application and verified that I am speaking with the correct person using two identifiers. Pt MRN:  [id number] Pt DOB:  30-Jul-1971 Video Participants:  Doneta Public   History of Present Illness:  Travis Vasquez is a 48 year old male who presents for "stroke-like symptoms".  History supplemented by ED and referring provider notes.  Around 11/05/18, he was working at his desk and noted blurred vision.  He later had trouble reading anything on the TV.  Symptoms resolved within a few hours. On 04/07/19, he started feeling "weird".  He can't explain it.  The next morning, he felt an intense throbbing in his left arm (no numbness, pain or weakness).  He later was talking to his son and noted he was slurring his words.  He presented to Urgent care and was noted to possibly have right facial weakness.  Blood pressure was elevated and he was sent to the ED due to elevated blood pressure and evaluation of stroke.  No focal or lateralizing symptoms noted on their exam (such as facial droop).  CT of head personally reviewed and unremarkable.  Blood pressure was low 190s/low 130s.  Labs revealed Cr of 1.65 with GFR of 57.  Elevated blood pressure was  thought to be secondary to renal insufficiency.  He was started on amlodipine.  The next couple of days, he had a right temporal/occipital non-throbbing and throbbing headache (responded to Tylenol) as well as worsening of the right facial weakness.  He is being referred to nephrology.  Since then, he has had two other similar headaches lasting about a day with Tylenol.  He has a history of headaches over the past year, usually bitemporal throbbing headaches every month.    Past Medical History: No past medical history on file.  Medications: Outpatient Encounter Medications as of 04/18/2019  Medication Sig   amLODipine (NORVASC) 5 MG tablet Take 1 tablet (5 mg total) by mouth daily.   azithromycin (ZITHROMAX) 250 MG tablet Take 1 tablet (250 mg total) by mouth daily. Take first 2 tablets together, then 1 every day until finished. (Patient not taking: Reported on 04/08/2019)   benzonatate (TESSALON) 100 MG capsule Take 1 capsule (100 mg total) by mouth every 8 (eight) hours. (Patient not taking: Reported on 04/08/2019)   fluticasone (FLONASE) 50 MCG/ACT nasal spray Place 2 sprays into both nostrils daily. (Patient not taking: Reported on 04/08/2019)   ibuprofen (ADVIL) 200 MG tablet Take 600 mg by mouth every 6 (six) hours as needed for fever, headache or moderate pain.   ipratropium (ATROVENT) 0.06 % nasal spray Place 2 sprays into both nostrils 4 (four) times daily. (Patient not taking: Reported on 04/08/2019)   Multiple Vitamin (MULTIVITAMIN WITH MINERALS) TABS tablet Take 1 tablet by mouth daily.   naproxen (NAPROSYN) 375 MG  tablet Take 1 tablet (375 mg total) by mouth 2 (two) times daily. (Patient not taking: Reported on 04/08/2019)   OVER THE COUNTER MEDICATION Take 1 drop by mouth daily. CBD   No facility-administered encounter medications on file as of 04/18/2019.     Allergies: Allergies  Allergen Reactions   Keflex [Cephalexin] Nausea And Vomiting    Family History: No family  history on file.  Social History: Social History   Socioeconomic History   Marital status: Single    Spouse name: Not on file   Number of children: Not on file   Years of education: Not on file   Highest education level: Not on file  Occupational History   Not on file  Social Needs   Financial resource strain: Not on file   Food insecurity    Worry: Not on file    Inability: Not on file   Transportation needs    Medical: Not on file    Non-medical: Not on file  Tobacco Use   Smoking status: Never Smoker   Smokeless tobacco: Never Used  Substance and Sexual Activity   Alcohol use: No   Drug use: No   Sexual activity: Not on file  Lifestyle   Physical activity    Days per week: Not on file    Minutes per session: Not on file   Stress: Not on file  Relationships   Social connections    Talks on phone: Not on file    Gets together: Not on file    Attends religious service: Not on file    Active member of club or organization: Not on file    Attends meetings of clubs or organizations: Not on file    Relationship status: Not on file   Intimate partner violence    Fear of current or ex partner: Not on file    Emotionally abused: Not on file    Physically abused: Not on file    Forced sexual activity: Not on file  Other Topics Concern   Not on file  Social History Narrative   Not on file    Observations/Objective:   Height 5\' 10"  (1.778 m), weight 212 lb (96.2 kg). No acute distress.  Alert and oriented.  Speech fluent and not dysarthric.  Language intact.  Eyes orthophoric on primary gaze.  Face symmetric.  Assessment and Plan:   Stroke-like event.  Other than the right sided facial droop, no specific focal or lateralizing symptoms to indicate stroke. He showed me a video from when he reportedly had worsening facial weakness.  I really couldn't appreciate any facial weakness.  Based on his age and lack of risk factors (the HTN is a new diagnosis),  suspect complicated migraine rather than stroke.  I cannot explain why he still sometimes slurs his speech.  Sometimes there are persistent migraine aura symptoms.  I would still cautiously treat for secondary stroke prevention  1.  When cleared by his PCP and nephrology, recommend starting ASA 81mg  daily 2.  Check MRI/MRA of head 3.  Check carotid doppler 4.  Tylenol for abortive headache therapy 5.  At this point, he states headaches not frequent enough to require preventative medication. 6.  Follow up in 3 months.  Further recommendations pending results.  Follow Up Instructions:    -I discussed the assessment and treatment plan with the patient. The patient was provided an opportunity to ask questions and all were answered. The patient agreed with the plan and demonstrated an  understanding of the instructions.   The patient was advised to call back or seek an in-person evaluation if the symptoms worsen or if the condition fails to improve as anticipated.   Cira ServantAdam Robert Tasha Diaz, DO

## 2019-04-18 ENCOUNTER — Encounter: Payer: Self-pay | Admitting: Neurology

## 2019-04-18 ENCOUNTER — Telehealth (INDEPENDENT_AMBULATORY_CARE_PROVIDER_SITE_OTHER): Payer: BC Managed Care – PPO | Admitting: Neurology

## 2019-04-18 ENCOUNTER — Other Ambulatory Visit: Payer: Self-pay

## 2019-04-18 VITALS — Ht 70.0 in | Wt 212.0 lb

## 2019-04-18 DIAGNOSIS — I10 Essential (primary) hypertension: Secondary | ICD-10-CM

## 2019-04-18 DIAGNOSIS — R2990 Unspecified symptoms and signs involving the nervous system: Secondary | ICD-10-CM

## 2019-04-18 DIAGNOSIS — G43109 Migraine with aura, not intractable, without status migrainosus: Secondary | ICD-10-CM

## 2019-04-18 DIAGNOSIS — I639 Cerebral infarction, unspecified: Secondary | ICD-10-CM

## 2019-04-18 NOTE — Addendum Note (Signed)
Addended by: Clois Comber on: 04/18/2019 02:01 PM   Modules accepted: Orders

## 2019-04-28 ENCOUNTER — Other Ambulatory Visit: Payer: BC Managed Care – PPO

## 2019-05-05 ENCOUNTER — Ambulatory Visit
Admission: RE | Admit: 2019-05-05 | Discharge: 2019-05-05 | Disposition: A | Discharge status: Home / Self Care | Payer: BC Managed Care – PPO | Source: Ambulatory Visit | Attending: Neurology | Admitting: Neurology

## 2019-05-05 DIAGNOSIS — R2990 Unspecified symptoms and signs involving the nervous system: Secondary | ICD-10-CM

## 2019-05-06 ENCOUNTER — Telehealth: Payer: Self-pay

## 2019-05-06 NOTE — Telephone Encounter (Signed)
-----   Message from Pieter Partridge, DO sent at 05/05/2019  7:09 PM EDT ----- Carotid ultrasound shows some plaque (or cholesterol) build up in the carotid arteries, but not causing any significant blockage.  As we discussed at his visit, he should start aspirin 81mg  daily once cleared by nephrology and his PCP, exercise and make sure his cholesterol is controlled (managed by PCP)

## 2019-05-06 NOTE — Telephone Encounter (Signed)
Patient notified of results.

## 2019-05-18 ENCOUNTER — Other Ambulatory Visit: Payer: BC Managed Care – PPO

## 2019-05-20 ENCOUNTER — Telehealth: Payer: Self-pay | Admitting: *Deleted

## 2019-05-24 NOTE — Telephone Encounter (Addendum)
  Peer to Peer (MD to MD) was requested Friday 9/4 for MRA/MRI approval  - Dr. Tomi Likens called [employer] at number given and they stated did not need to be peer to peer.  [employer] instead requested office notes/documentation why patient needed MRI and MRA of head ordered 8/3. Office notes faxed to [place] at [phone] per request. [employer] fax received.Travis Vasquez's phone # is [phone]. MD stated he ordered both tests for a stroke like event.

## 2019-05-26 ENCOUNTER — Telehealth: Payer: Self-pay | Admitting: *Deleted

## 2019-05-26 NOTE — Telephone Encounter (Addendum)
Received voice mail that the MRI HAS been approved by [facility] after receiving office notes.   Authorization [id number]  Dr. Tomi Likens FYI above.

## 2019-05-26 NOTE — Telephone Encounter (Signed)
Opened in error

## 2019-06-06 ENCOUNTER — Other Ambulatory Visit: Payer: Self-pay

## 2019-06-06 ENCOUNTER — Other Ambulatory Visit: Payer: Self-pay | Admitting: *Deleted

## 2019-06-06 VITALS — Temp 97.5°F

## 2019-06-06 DIAGNOSIS — Z125 Encounter for screening for malignant neoplasm of prostate: Secondary | ICD-10-CM

## 2019-06-06 NOTE — Progress Notes (Signed)
Patient: Travis Vasquez           Date of Birth: [birth year]           MRN: [id number] Visit Date: 06/06/2019 PCP: Emelia Loron, NP  Prostate Cancer Screening Date of last physical exam: (month ago) Date of last rectal exam: (never) Have you ever had or been told you have an allergy to latex products?: No Are you currently taking any natural prostate preparations?: No Are you currently experiencing any urinary symptoms?: No  Prostate Exam Exam not completed.  Patient's History Patient Active Problem List   Diagnosis Date Noted  . Left knee pain 05/15/2016   No past medical history on file.  No family history on file.  Social History   Occupational History  . Occupation: taxes  Tobacco Use  . Smoking status: Never Smoker  . Smokeless tobacco: Never Used  Substance and Sexual Activity  . Alcohol use: No  . Drug use: No  . Sexual activity: Not on file

## 2019-06-07 LAB — PSA: Prostate Specific Ag, Serum: 0.9 ng/mL (ref 0.0–4.0)

## 2019-06-10 ENCOUNTER — Other Ambulatory Visit: Payer: BC Managed Care – PPO

## 2019-08-02 NOTE — Progress Notes (Signed)
NEUROLOGY FOLLOW UP OFFICE NOTE  Travis Vasquez [id number]  HISTORY OF PRESENT ILLNESS: Travis Vasquez is a 48 year old male who follows up for "stroke-like symptoms".   UPDATE: Underwent stroke workup: Carotid doppler from 05/05/2019 showed bilateral carotid bifurcation plaque resulting in less than 50% ICA stenosis, antegrade flow in bilateral vertebral arteries.  MRI/MRA of head was ordered but he didn't have it performed because he had a cold at the time.    He has been doing well.  Headaches resolved.  Sometimes when he speaks, his words are sometimes unintelligible.    HISTORY: Around 11/05/18, he was working at his desk and noted blurred vision.  He later had trouble reading anything on the TV.  Symptoms resolved within a few hours. On 04/07/19, he started feeling "weird".  He can't explain it.  The next morning, he felt an intense throbbing in his left arm (no numbness, pain or weakness).  He later was talking to his son and noted he was slurring his words.  He presented to Urgent care and was noted to possibly have right facial weakness.  Blood pressure was elevated and he was sent to the ED due to elevated blood pressure and evaluation of stroke.  No focal or lateralizing symptoms noted on their exam (such as facial droop).  CT of head personally reviewed and unremarkable.  Blood pressure was low 190s/low 130s.  Labs revealed Cr of 1.65 with GFR of 57.  Elevated blood pressure was thought to be secondary to renal insufficiency.  He was started on amlodipine.  The next couple of days, he had a right temporal/occipital non-throbbing and throbbing headache (responded to Tylenol) as well as worsening of the right facial weakness.  He is being referred to nephrology.  Since then, he has had two other similar headaches lasting about a day with Tylenol.  He has a history of headaches over the past year, usually bitemporal throbbing headaches every month.    PAST MEDICAL HISTORY: Past  Medical History:  Diagnosis Date  . Hypertension     MEDICATIONS: Current Outpatient Medications on File Prior to Visit  Medication Sig Dispense Refill  . amLODipine (NORVASC) 5 MG tablet Take 1 tablet (5 mg total) by mouth daily. 30 tablet 1  . Multiple Vitamin (MULTIVITAMIN WITH MINERALS) TABS tablet Take 1 tablet by mouth daily.    Marland Kitchen OVER THE COUNTER MEDICATION Take 1 drop by mouth daily. CBD     No current facility-administered medications on file prior to visit.     ALLERGIES: Allergies  Allergen Reactions  . Keflex [Cephalexin] Nausea And Vomiting    FAMILY HISTORY: History reviewed. No pertinent family history.  SOCIAL HISTORY: Social History   Socioeconomic History  . Marital status: Married    Spouse name: Denny Peon  . Number of children: 2  . Years of education: 69  . Highest education level: Master's degree (e.g., MA, MS, MEng, MEd, MSW, MBA)  Occupational History  . Occupation: taxes  Social Needs  . Financial resource strain: Not on file  . Food insecurity    Worry: Not on file    Inability: Not on file  . Transportation needs    Medical: Not on file    Non-medical: Not on file  Tobacco Use  . Smoking status: Never Smoker  . Smokeless tobacco: Never Used  Substance and Sexual Activity  . Alcohol use: No  . Drug use: No  . Sexual activity: Not on file  Lifestyle  . Physical  activity    Days per week: Not on file    Minutes per session: Not on file  . Stress: Not on file  Relationships  . Social Musician on phone: Not on file    Gets together: Not on file    Attends religious service: Not on file    Active member of club or organization: Not on file    Attends meetings of clubs or organizations: Not on file    Relationship status: Not on file  . Intimate partner violence    Fear of current or ex partner: Not on file    Emotionally abused: Not on file    Physically abused: Not on file    Forced sexual activity: Not on file  Other  Topics Concern  . Not on file  Social History Narrative   Patient is right-handed. He lives with his wife in a two level home. He drinks one cup of coffee a days and an occasional soda. He does not exercise.     REVIEW OF SYSTEMS: Constitutional: No fevers, chills, or sweats, no generalized fatigue, change in appetite Eyes: No visual changes, double vision, eye pain Ear, nose and throat: No hearing loss, ear pain, nasal congestion, sore throat Cardiovascular: No chest pain, palpitations Respiratory:  No shortness of breath at rest or with exertion, wheezes GastrointestinaI: No nausea, vomiting, diarrhea, abdominal pain, fecal incontinence Genitourinary:  No dysuria, urinary retention or frequency Musculoskeletal:  No neck pain, back pain Integumentary: No rash, pruritus, skin lesions Neurological: as above Psychiatric: No depression, insomnia, anxiety Endocrine: No palpitations, fatigue, diaphoresis, mood swings, change in appetite, change in weight, increased thirst Hematologic/Lymphatic:  No purpura, petechiae. Allergic/Immunologic: no itchy/runny eyes, nasal congestion, recent allergic reactions, rashes  PHYSICAL EXAM: Blood pressure 134/86, pulse 76, height 5\' 9"  (1.753 m), weight 202 lb 9.6 oz (91.9 kg), SpO2 96 %. General: No acute distress.  Patient appears well-groomed.   Head:  Normocephalic/atraumatic Eyes:  Fundi examined but not visualized Neck: supple, no paraspinal tenderness, full range of motion Heart:  Regular rate and rhythm Lungs:  Clear to auscultation bilaterally Back: No paraspinal tenderness Neurological Exam: alert and oriented to person, place, and time. Attention span and concentration intact, recent and remote memory intact, fund of knowledge intact.  Speech fluent and not dysarthric, language intact.  CN II-XII intact. Bulk and tone normal, muscle strength 5/5 throughout.  Sensation to light touch, temperature and vibration intact.  Deep tendon reflexes 2+  throughout, toes downgoing.  Finger to nose and heel to shin testing intact.  Gait normal, Romberg negative.  IMPRESSION: Stroke-like event.  Other than the right sided facial droop, no specific focal or lateralizing symptoms to indicate stroke. 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  I cannot explain why he still sometimes slurs his speech.  Sometimes there are persistent migraine aura symptoms.  I would still cautiously treat for secondary stroke prevention  PLAN: 1.  ASA 81mg  daily 2.  MRI and MRA of head. 3.  Follow up in 6 months.  15 minutes spent face to face with patient, over 50% spent discussing .  , DO  CC: , NP

## 2019-08-04 ENCOUNTER — Ambulatory Visit: Payer: BC Managed Care – PPO | Admitting: Neurology

## 2019-08-04 ENCOUNTER — Encounter: Payer: Self-pay | Admitting: Neurology

## 2019-08-04 ENCOUNTER — Other Ambulatory Visit: Payer: Self-pay

## 2019-08-04 VITALS — BP 134/86 | HR 76 | Ht 69.0 in | Wt 202.6 lb

## 2019-08-04 DIAGNOSIS — R2990 Unspecified symptoms and signs involving the nervous system: Secondary | ICD-10-CM | POA: Diagnosis not present

## 2019-08-04 DIAGNOSIS — G43109 Migraine with aura, not intractable, without status migrainosus: Secondary | ICD-10-CM | POA: Diagnosis not present

## 2019-08-04 NOTE — Patient Instructions (Signed)
Your orders for MRI BRAIN AND MRA HEAD are still valid. Please contact [facility] to schedule at [phone]

## 2020-01-31 NOTE — Progress Notes (Deleted)
NEUROLOGY FOLLOW UP OFFICE NOTE  Travis Vasquez [id number]  HISTORY OF PRESENT ILLNESS: Travis Vasquez is a 49 year old male who follows up for "stroke-like symptoms".   UPDATE: MRI/MRA of head was again ordered but not performed ***  HISTORY: Around 11/05/18, he was working at his desk and noted blurred vision. He later had trouble reading anything on the TV. Symptoms resolved within a few hours.On 04/07/19, hestarted feeling "weird". He can't explain it.The next morning, he felt an intense throbbing in his left arm (no numbness, pain or weakness). He later was talking to his son and noted he was slurring his words. He presented to Urgent careand was noted to possibly have right facial weakness. Blood pressure was elevated and hewas sent to the ED due to elevated blood pressure and evaluation of stroke. No focal or lateralizing symptoms noted on their exam (such as facial droop).CT of head personally reviewed and unremarkable. Blood pressure was low 190s/low 130s. Labs revealed Cr of 1.65 with GFR of 57. Elevated blood pressure was thought to be secondary to renal insufficiency. He was started on amlodipine. The next couple of days, he had a right temporal/occipital non-throbbing and throbbing headache (responded to Tylenol) as well as worsening of the right facial weakness. He was referred to nephrology. Since then, he has had two other similar headaches lasting about a day with Tylenol. He has a history of headaches over the past year, usually bitemporal throbbing headaches every month. Carotid doppler from 05/05/2019 showed bilateral carotid bifurcation plaque resulting in less than 50% ICA stenosis, antegrade flow in bilateral vertebral arteries.  PAST MEDICAL HISTORY: Past Medical History:  Diagnosis Date  . Hypertension     MEDICATIONS: Current Outpatient Medications on File Prior to Visit  Medication Sig Dispense Refill  . amLODipine-benazepril (LOTREL) 5-10  MG capsule Take 1 capsule by mouth at bedtime.    . Multiple Vitamin (MULTIVITAMIN WITH MINERALS) TABS tablet Take 1 tablet by mouth daily.    Marland Kitchen OVER THE COUNTER MEDICATION Take 1 drop by mouth daily. CBD     No current facility-administered medications on file prior to visit.    ALLERGIES: Allergies  Allergen Reactions  . Keflex [Cephalexin] Nausea And Vomiting    FAMILY HISTORY: No family history on file.  SOCIAL HISTORY: Social History   Socioeconomic History  . Marital status: Married    Spouse name: Denny Peon  . Number of children: 2  . Years of education: 16  . Highest education level: Bachelor's degree (e.g., BA, AB, BS)  Occupational History  . Occupation: taxes  Tobacco Use  . Smoking status: Never Smoker  . Smokeless tobacco: Never Used  Substance and Sexual Activity  . Alcohol use: No  . Drug use: No  . Sexual activity: Not on file  Other Topics Concern  . Not on file  Social History Narrative   Patient is right-handed. He lives with his wife in a two level home. He drinks one cup of coffee a days and an occasional soda. He does not exercise.    Social Determinants of Health   Financial Resource Strain:   . Difficulty of Paying Living Expenses:   Food Insecurity:   . Worried About Charity fundraiser in the Last Year:   . Arboriculturist in the Last Year:   Transportation Needs:   . Film/video editor (Medical):   Marland Kitchen Lack of Transportation (Non-Medical):   Physical Activity:   . Days of Exercise per Week:   .  Minutes of Exercise per Session:   Stress:   . Feeling of Stress :   Social Connections:   . Frequency of Communication with Friends and Family:   . Frequency of Social Gatherings with Friends and Family:   . Attends Religious Services:   . Active Member of Clubs or Organizations:   . Attends Banker Meetings:   Marland Kitchen Marital Status:   Intimate Partner Violence:   . Fear of Current or Ex-Partner:   . Emotionally Abused:   Marland Kitchen  Physically Abused:   . Sexually Abused:     PHYSICAL EXAM: *** General: No acute distress.  Patient appears well-groomed.   Head:  Normocephalic/atraumatic Eyes:  Fundi examined but not visualized Neck: supple, no paraspinal tenderness, full range of motion Heart:  Regular rate and rhythm Lungs:  Clear to auscultation bilaterally Back: No paraspinal tenderness Neurological Exam: alert and oriented to person, place, and time. Attention span and concentration intact, recent and remote memory intact, fund of knowledge intact.  Speech fluent and not dysarthric, language intact.  CN II-XII intact. Bulk and tone normal, muscle strength 5/5 throughout.  Sensation to light touch, temperature and vibration intact.  Deep tendon reflexes 2+ throughout, toes downgoing.  Finger to nose and heel to shin testing intact.  Gait normal, Romberg negative.  IMPRESSION: Stroke-like event.  Other than the right sided facial droop, no specific focal or lateralizing symptoms to indicate stroke. 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 I cannot explain why he still sometimes slurs his speech. Sometimes there are persistent migraine aura symptoms. I would still cautiously treat for secondary stroke prevention  PLAN: ***  Shon Millet, DO  CC: Carmel [place], NP

## 2020-02-02 ENCOUNTER — Ambulatory Visit: Payer: BC Managed Care – PPO | Admitting: Neurology

## 2020-03-16 ENCOUNTER — Ambulatory Visit: Payer: BC Managed Care – PPO | Admitting: Neurology

## 2020-03-16 NOTE — Progress Notes (Deleted)
NEUROLOGY FOLLOW UP OFFICE NOTE  Travis Vasquez [id number]  HISTORY OF PRESENT ILLNESS: Travis Vasquez is a 49 year old male who follows up for "stroke-like symptoms".  UPDATE: He still hasn't had the MRI and MRA of the head.    HISTORY: Around 11/05/18, he was working at his desk and noted blurred vision. He later had trouble reading anything on the TV. Symptoms resolved within a few hours.On 04/07/19, hestarted feeling "weird". He can't explain it.The next morning, he felt an intense throbbing in his left arm (no numbness, pain or weakness). He later was talking to his son and noted he was slurring his words. He presented to Urgent careand was noted to possibly have right facial weakness. Blood pressure was elevated and hewas sent to the ED due to elevated blood pressure and evaluation of stroke. No focal or lateralizing symptoms noted on their exam (such as facial droop).CT of head personally reviewed and unremarkable. Blood pressure was low 190s/low 130s. Labs revealed Cr of 1.65 with GFR of 57. Elevated blood pressure was thought to be secondary to renal insufficiency. He was started on amlodipine. The next couple of days, he had a right temporal/occipital non-throbbing and throbbing headache (responded to Tylenol) as well as worsening of the right facial weakness. Carotid doppler from 05/05/2019 showed bilateral carotid bifurcation plaque resulting in less than 50% ICA stenosis, antegrade flow in bilateral vertebral arteries.  He has a history of headaches since 2019, usually bitemporal throbbing headaches every month.   PAST MEDICAL HISTORY: Past Medical History:  Diagnosis Date  . Hypertension     MEDICATIONS: Current Outpatient Medications on File Prior to Visit  Medication Sig Dispense Refill  . amLODipine-benazepril (LOTREL) 5-10 MG capsule Take 1 capsule by mouth at bedtime.    . Multiple Vitamin (MULTIVITAMIN WITH MINERALS) TABS tablet Take 1 tablet by  mouth daily.    Marland Kitchen OVER THE COUNTER MEDICATION Take 1 drop by mouth daily. CBD     No current facility-administered medications on file prior to visit.    ALLERGIES: Allergies  Allergen Reactions  . Keflex [Cephalexin] Nausea And Vomiting    FAMILY HISTORY: No family history on file. ***.  SOCIAL HISTORY: Social History   Socioeconomic History  . Marital status: Married    Spouse name: Tacey Ruiz  . Number of children: 2  . Years of education: 16  . Highest education level: Bachelor's degree (e.g., BA, AB, BS)  Occupational History  . Occupation: taxes  Tobacco Use  . Smoking status: Never Smoker  . Smokeless tobacco: Never Used  Vaping Use  . Vaping Use: Never used  Substance and Sexual Activity  . Alcohol use: No  . Drug use: No  . Sexual activity: Not on file  Other Topics Concern  . Not on file  Social History Narrative   Patient is right-handed. He lives with his wife in a two level home. He drinks one cup of coffee a days and an occasional soda. He does not exercise.    Social Determinants of Health   Financial Resource Strain:   . Difficulty of Paying Living Expenses:   Food Insecurity:   . Worried About Programme researcher, broadcasting/film/video in the Last Year:   . Barista in the Last Year:   Transportation Needs:   . Freight forwarder (Medical):   Marland Kitchen Lack of Transportation (Non-Medical):   Physical Activity:   . Days of Exercise per Week:   . Minutes of Exercise per Session:  Stress:   . Feeling of Stress :   Social Connections:   . Frequency of Communication with Friends and Family:   . Frequency of Social Gatherings with Friends and Family:   . Attends Religious Services:   . Active Member of Clubs or Organizations:   . Attends Banker Meetings:   Marland Kitchen Marital Status:   Intimate Partner Violence:   . Fear of Current or Ex-Partner:   . Emotionally Abused:   Marland Kitchen Physically Abused:   . Sexually Abused:     REVIEW OF SYSTEMS: Constitutional: No  fevers, chills, or sweats, no generalized fatigue, change in appetite Eyes: No visual changes, double vision, eye pain Ear, nose and throat: No hearing loss, ear pain, nasal congestion, sore throat Cardiovascular: No chest pain, palpitations Respiratory:  No shortness of breath at rest or with exertion, wheezes GastrointestinaI: No nausea, vomiting, diarrhea, abdominal pain, fecal incontinence Genitourinary:  No dysuria, urinary retention or frequency Musculoskeletal:  No neck pain, back pain Integumentary: No rash, pruritus, skin lesions Neurological: as above Psychiatric: No depression, insomnia, anxiety Endocrine: No palpitations, fatigue, diaphoresis, mood swings, change in appetite, change in weight, increased thirst Hematologic/Lymphatic:  No purpura, petechiae. Allergic/Immunologic: no itchy/runny eyes, nasal congestion, recent allergic reactions, rashes  PHYSICAL EXAM: *** General: No acute distress.  Patient appears ***-groomed.   Head:  Normocephalic/atraumatic Eyes:  Fundi examined but not visualized Neck: supple, no paraspinal tenderness, full range of motion Heart:  Regular rate and rhythm Lungs:  Clear to auscultation bilaterally Back: No paraspinal tenderness Neurological Exam: alert and oriented to person, place, and time. Attention span and concentration intact, recent and remote memory intact, fund of knowledge intact.  Speech fluent and not dysarthric, language intact.  CN II-XII intact. Bulk and tone normal, muscle strength 5/5 throughout.  Sensation to light touch, temperature and vibration intact.  Deep tendon reflexes 2+ throughout, toes downgoing.  Finger to nose and heel to shin testing intact.  Gait normal, Romberg negative.  IMPRESSION: ***  PLAN: ***  Shon Millet, DO  CC: ***

## 2020-06-12 ENCOUNTER — Ambulatory Visit: Payer: BC Managed Care – PPO | Admitting: Neurology

## 2020-09-25 IMAGING — CT CT HEAD WITHOUT CONTRAST
3 series · 14 of 46 positions shown, 16 images · non-contrast
Comparison: None.

CLINICAL DATA: Slurred speech with pulsing sensation in left arm on
awakening. Blurred vision several days ago. History of hypertension.

EXAM:
CT HEAD WITHOUT CONTRAST
TECHNIQUE: Contiguous axial images were obtained from the base of the skull
through the vertex without intravenous contrast.

[Series 2: head wo · axial · 0.47mm/px · z∈[-141,-21]mm · 8 of 29 slices shown, 10 images]
[im 3/29  brain]
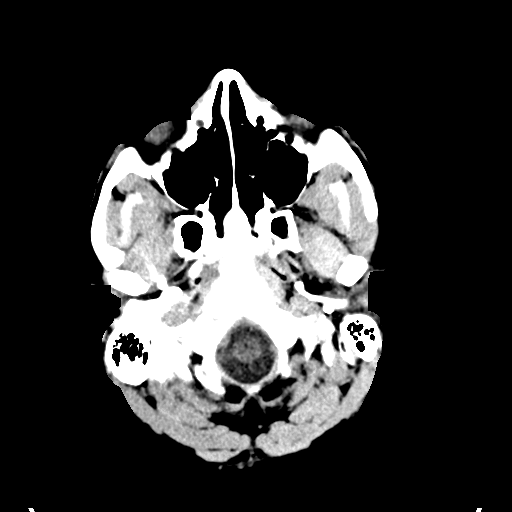
[im 3/29  bone]
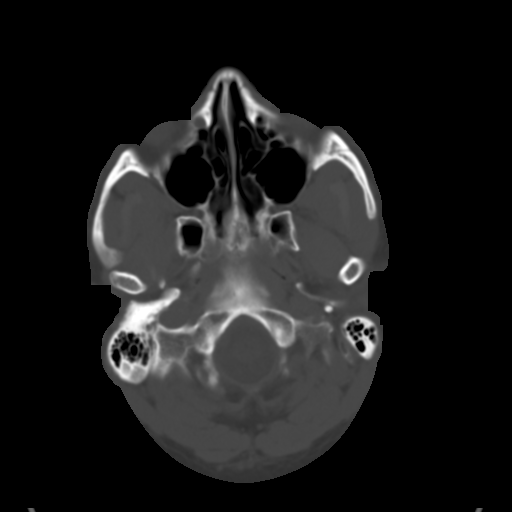
[im 7/29  brain]
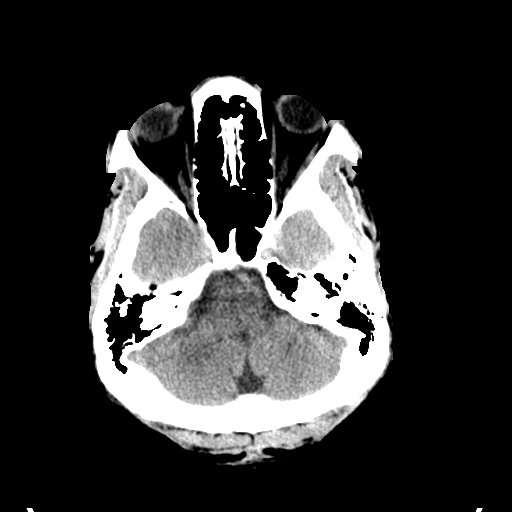
[im 10/29  brain]
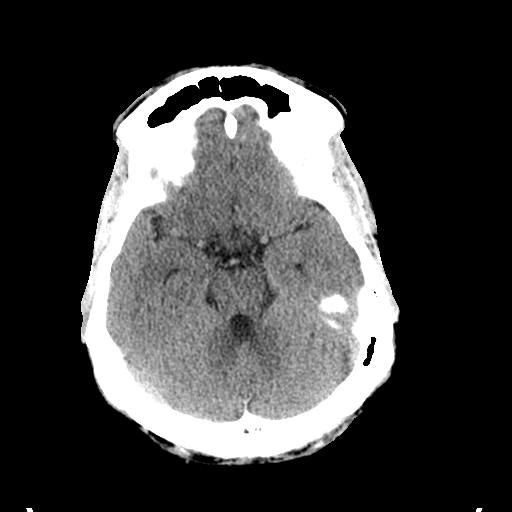
[im 13/29  brain]
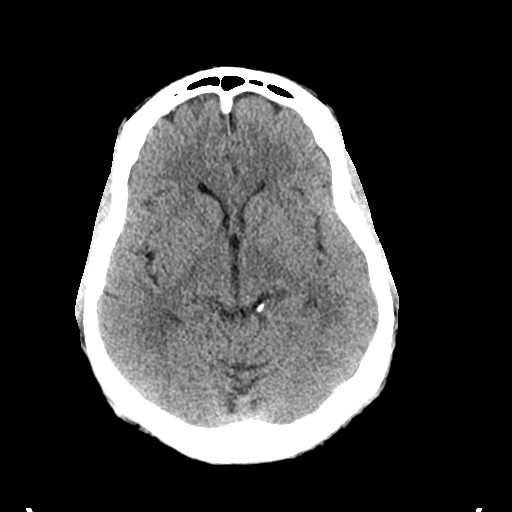
[im 17/29  brain]
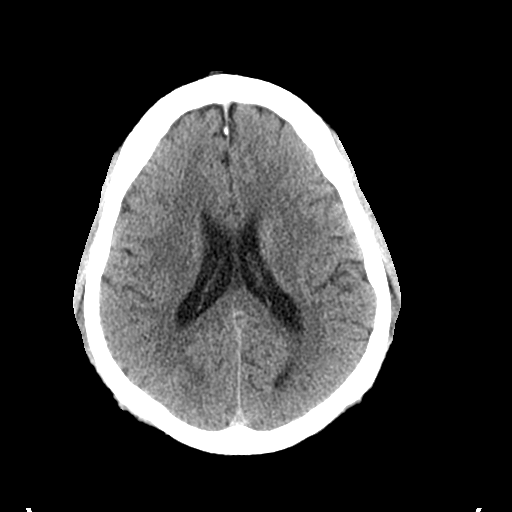
[im 17/29  bone]
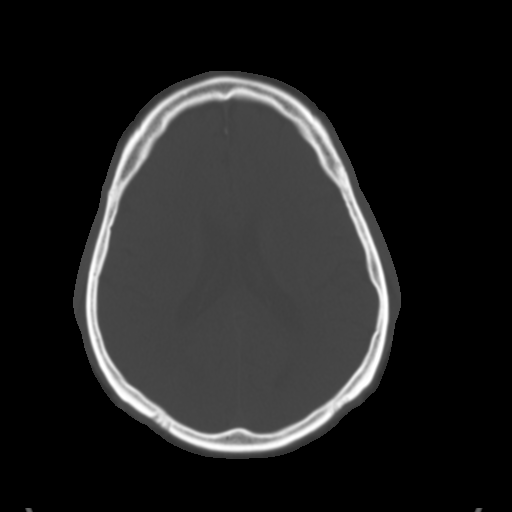
[im 20/29  brain]
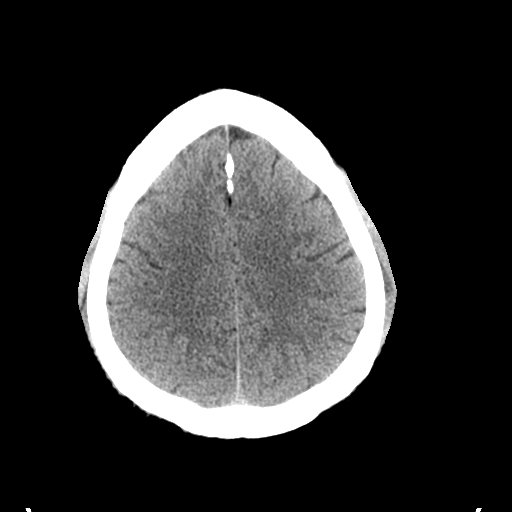
[im 23/29  brain]
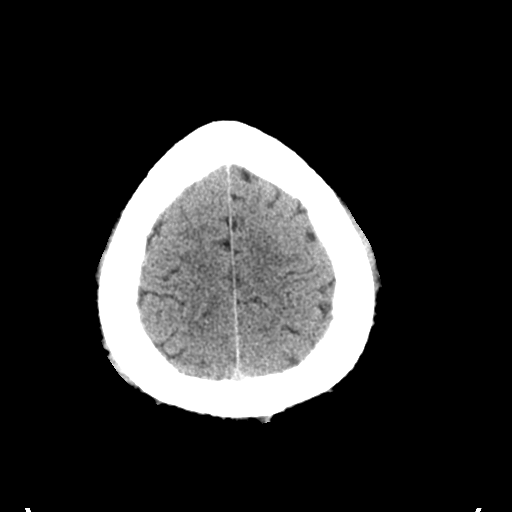
[im 27/29  brain]
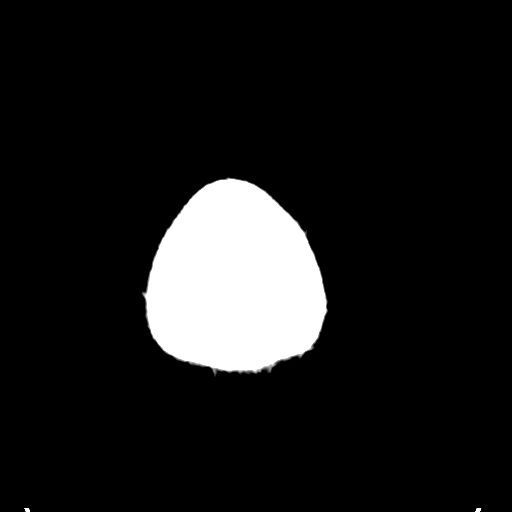

[Series 4: coronal soft tissue · coronal · 0.36mm/px · 3 of 69 slices shown]
[im 23/69  brain]
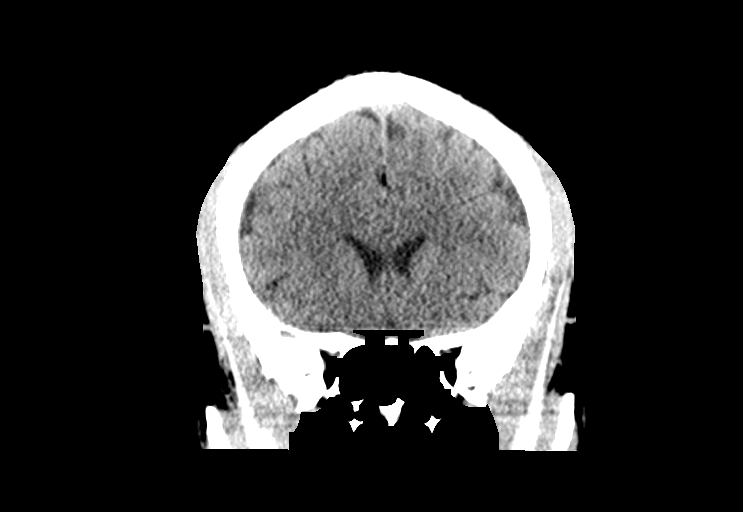
[im 31/69  brain]
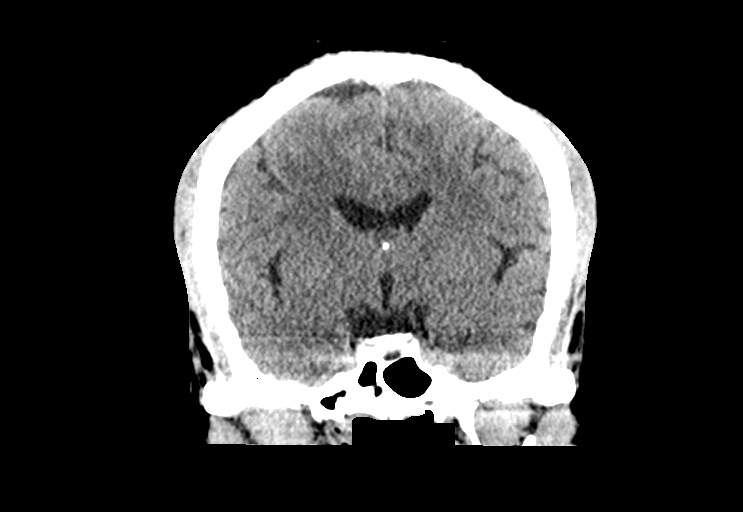
[im 38/69  brain]
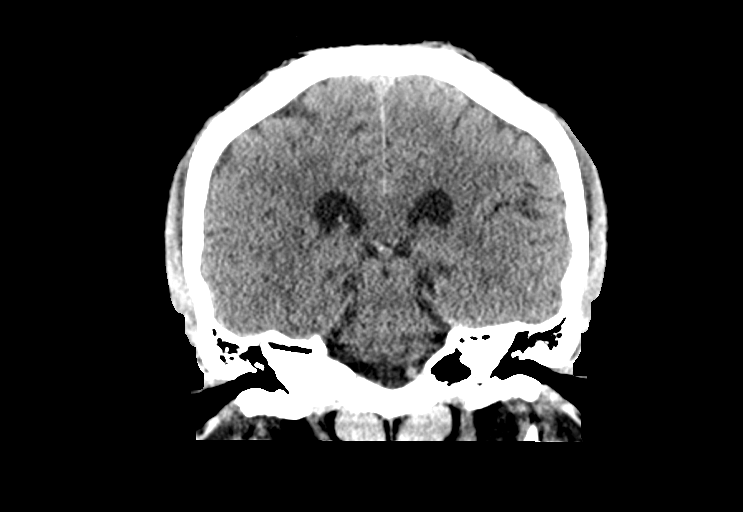

[Series 5: sagittal soft tissue · sagittal · 0.37mm/px · 3 of 53 slices shown]
[im 18/53  brain]
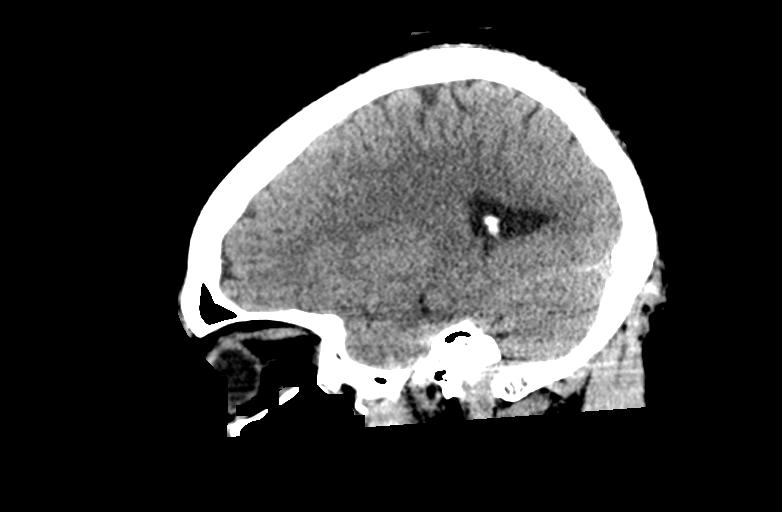
[im 27/53  brain]
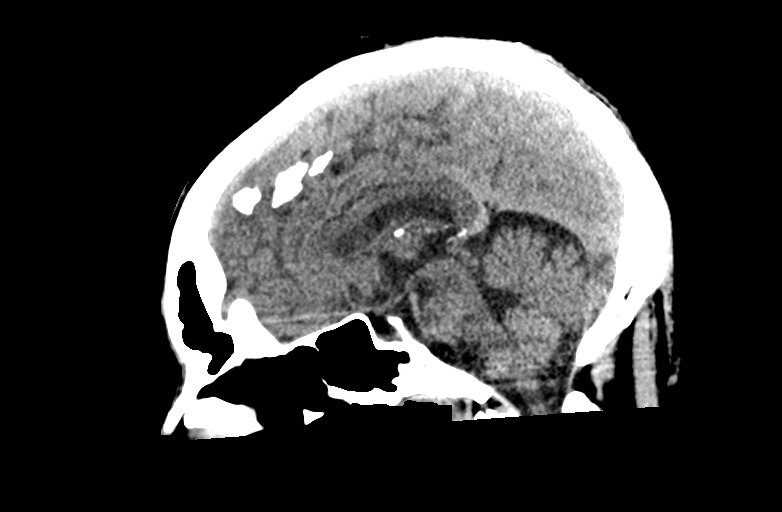
[im 35/53  brain]
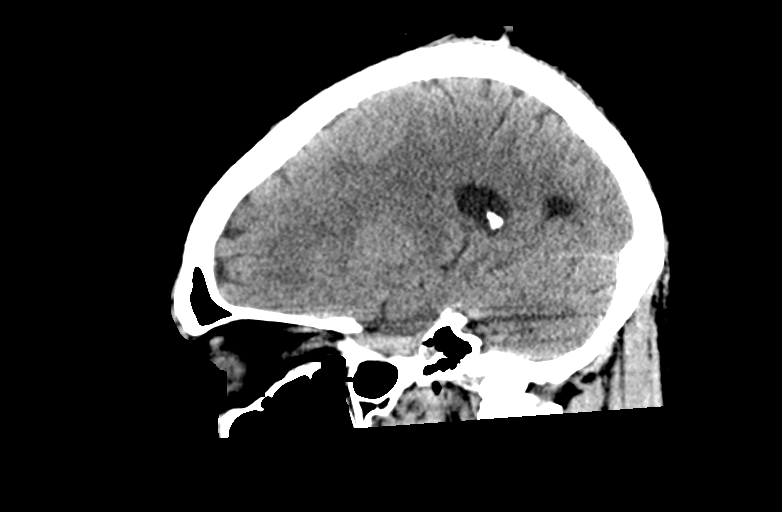

[14 of 46 positions shown; findings below may reference images not displayed]

FINDINGS: Brain: There is no evidence of acute intracranial hemorrhage, mass
lesion, brain edema or extra-axial fluid collection. The ventricles
and subarachnoid spaces are appropriately sized for age. There is no
CT evidence of acute cortical infarction.

Vascular:  No hyperdense vessel identified.

Skull: Negative for fracture or focal lesion.

Sinuses/Orbits: The visualized paranasal sinuses and mastoid air
cells are clear. No orbital abnormalities are seen.

Other: None.
IMPRESSION: Negative noncontrast head CT.  No CT evidence of acute stroke.

## 2021-07-11 NOTE — Progress Notes (Deleted)
NEUROLOGY FOLLOW UP OFFICE NOTE  Travis Vasquez [id number]  Assessment/Plan:   Stroke-like event.  Other than the right sided facial droop, no specific focal or lateralizing symptoms to indicate stroke. 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  I cannot explain why he still sometimes slurs his speech.  Sometimes there are persistent migraine aura symptoms.  I would still cautiously treat for secondary stroke prevention  Subjective:  Travis Vasquez is a 50 year old male who follows up for "stroke-like symptoms".    UPDATE: Last seen in November 2020.  At that time, MRI and MRA of head was ordered.  Patient never had the imaging performed and has not followed up until today.  ***    HISTORY: Around 11/05/18, he was working at his desk and noted blurred vision.  He later had trouble reading anything on the TV.  Symptoms resolved within a few hours. On 04/07/19, he started feeling "weird".  He can't explain it.  The next morning, he felt an intense throbbing in his left arm (no numbness, pain or weakness).  He later was talking to his son and noted he was slurring his words.  He presented to Urgent care and was noted to possibly have right facial weakness.  Blood pressure was elevated and he was sent to the ED due to elevated blood pressure and evaluation of stroke.  No focal or lateralizing symptoms noted on their exam (such as facial droop).  CT of head was unremarkable.  Blood pressure was low 190s/low 130s.  Labs revealed Cr of 1.65 with GFR of 57.  Elevated blood pressure was thought to be secondary to renal insufficiency.  He was started on amlodipine.  The next couple of days, he had a right temporal/occipital non-throbbing and throbbing headache (responded to Tylenol) as well as worsening of the right facial weakness.  He is being referred to nephrology.  Since then, he has had two other similar headaches lasting  about a day with Tylenol.  Carotid doppler from 05/05/2019 showed bilateral carotid bifurcation plaque resulting in less than 50% ICA stenosis, antegrade flow in bilateral vertebral arteries.  For that year, following this event, he had two similar headaches lasting about a day with Tylenol.  More common headaches he experienced were usually bitemporal throbbing headaches every month.    PAST MEDICAL HISTORY: Past Medical History:  Diagnosis Date   Hypertension     MEDICATIONS: Current Outpatient Medications on File Prior to Visit  Medication Sig Dispense Refill   amLODipine-benazepril (LOTREL) 5-10 MG capsule Take 1 capsule by mouth at bedtime.     Multiple Vitamin (MULTIVITAMIN WITH MINERALS) TABS tablet Take 1 tablet by mouth daily.     OVER THE COUNTER MEDICATION Take 1 drop by mouth daily. CBD     No current facility-administered medications on file prior to visit.    ALLERGIES: Allergies  Allergen Reactions   Keflex [Cephalexin] Nausea And Vomiting    FAMILY HISTORY: No family history on file.    Objective:  *** General: No acute distress.  Patient appears ***-groomed.   Head:  Normocephalic/atraumatic Eyes:  Fundi examined but not visualized Neck: supple, no paraspinal tenderness, full range of motion Heart:  Regular rate and rhythm Lungs:  Clear to auscultation bilaterally Back: No paraspinal tenderness Neurological Exam: alert and oriented to person, place, and time.  Speech fluent and not dysarthric, language intact.  CN II-XII intact. Bulk  and tone normal, muscle strength 5/5 throughout.  Sensation to light touch intact.  Deep tendon reflexes 2+ throughout, toes downgoing.  Finger to nose testing intact.  Gait normal, Romberg negative.   Shon Millet, DO  CC: ***

## 2021-07-15 ENCOUNTER — Ambulatory Visit: Payer: BC Managed Care – PPO | Admitting: Neurology
